# Patient Record
Sex: Female | Born: 1995 | Race: White | Hispanic: No | Marital: Married | Smoking: Current every day smoker
Health system: Southern US, Community
[De-identification: ages and names within clinical notes are randomized; demographics above are authoritative.]

## PROBLEM LIST (undated history)

## (undated) DIAGNOSIS — T148XXA Other injury of unspecified body region, initial encounter: Secondary | ICD-10-CM

## (undated) DIAGNOSIS — J40 Bronchitis, not specified as acute or chronic: Secondary | ICD-10-CM

## (undated) DIAGNOSIS — G43909 Migraine, unspecified, not intractable, without status migrainosus: Secondary | ICD-10-CM

## (undated) DIAGNOSIS — R109 Unspecified abdominal pain: Secondary | ICD-10-CM

## (undated) DIAGNOSIS — H539 Unspecified visual disturbance: Secondary | ICD-10-CM

## (undated) DIAGNOSIS — T7840XA Allergy, unspecified, initial encounter: Secondary | ICD-10-CM

## (undated) DIAGNOSIS — R51 Headache: Secondary | ICD-10-CM

## (undated) HISTORY — DX: Unspecified abdominal pain: R10.9

---
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
## 2014-03-07 ENCOUNTER — Encounter: Payer: Self-pay | Admitting: Obstetrics and Gynecology

## 2014-03-07 ENCOUNTER — Ambulatory Visit (INDEPENDENT_AMBULATORY_CARE_PROVIDER_SITE_OTHER): Payer: Commercial Managed Care - PPO

## 2014-03-07 ENCOUNTER — Other Ambulatory Visit: Payer: Self-pay | Admitting: Obstetrics and Gynecology

## 2014-03-07 ENCOUNTER — Ambulatory Visit (INDEPENDENT_AMBULATORY_CARE_PROVIDER_SITE_OTHER): Payer: Commercial Managed Care - PPO | Admitting: Obstetrics and Gynecology

## 2014-03-07 VITALS — BP 120/84 | Ht 67.0 in | Wt 205.0 lb

## 2014-03-07 DIAGNOSIS — Z30431 Encounter for routine checking of intrauterine contraceptive device: Secondary | ICD-10-CM

## 2014-03-07 DIAGNOSIS — T8339XA Other mechanical complication of intrauterine contraceptive device, initial encounter: Secondary | ICD-10-CM

## 2014-03-07 DIAGNOSIS — N898 Other specified noninflammatory disorders of vagina: Secondary | ICD-10-CM

## 2014-03-07 LAB — POCT WET PREP WITH KOH
Bacteria Wet Prep HPF POC: NEGATIVE
Clue Cells Wet Prep HPF POC: NEGATIVE
KOH Prep POC: NEGATIVE
RBC Wet Prep HPF POC: NEGATIVE
Trichomonas, UA: NEGATIVE
Yeast Wet Prep HPF POC: NEGATIVE

## 2014-03-07 MED ORDER — DOXYCYCLINE HYCLATE 100 MG PO CAPS: 100.0000 mg | ORAL_CAPSULE | Freq: Two times a day (BID) | ORAL | Status: DC

## 2014-03-07 NOTE — Progress Notes (Signed)
This chart was scribed by Jenne Campus, Medical Scribe, for Dr. Mallory Shirk on 03/07/14 at 2:31 PM. This chart was reviewed by Dr. Mallory Shirk and is accurate.   Patient ID: Lisa Bennett, female   DOB: [birth year], 18 y.o.   MRN: [id number] GYNECOLOGY CLINIC PROGRESS NOTE History:  18 y.o. No obstetric history on file. here today for today for IUD string check; Mirena IUD was placed on 06/29/13. No complaints about the Mirena. Having intermittent vaginal discharge that changes between thick and brown to red and slimy. Denies tampon use.  The following portions of the patient's history were reviewed and updated as appropriate: allergies, current medications, past family history, past medical history, past social history, past surgical history and problem list. Last pap smear is not on file.  Review of Systems:  Pertinent items are noted in HPI.  Objective:  Chaperone present for exam which was performed with pt's permission Physical Exam Blood pressure 120/84, height 5\' 7"  (1.702 m), weight 205 lb (92.987 kg). Gen: NAD Abd: Soft, nontender and nondistended Pelvic: Normal appearing external genitalia; normal appearing vaginal mucosa and cervix. Clear discharge present. IUD strings not visualized. Pelvic U/S planned.   Pelvic U/S shows IUD in proper position with surrounding fluid suggestive of infection. Uterus is non-tender  Assessment & Plan:  IUD recheck Doxycycline x10 days for leukorrhea Recheck in 3 weeks Routine preventative health maintenance measures emphasized.

## 2014-03-07 NOTE — Progress Notes (Deleted)
GYNECOLOGY CLINIC PROGRESS NOTE  History:  18 y.o. No obstetric history on file. here today for today for IUD string check; Mirena IUD was placed  ***. No complaints about the Mirena, no concerning side effects.  The following portions of the patient's history were reviewed and updated as appropriate: allergies, current medications, past family history, past medical history, past social history, past surgical history and problem list. Last pap smear on *** was normal, *** HRHPV.  Review of Systems:  Pertinent items are noted in HPI.  Objective:  Physical Exam Blood pressure 120/84, height 5\' 7"  (1.702 m), weight 205 lb (92.987 kg). Gen: NAD Abd: Soft, nontender and nondistended Pelvic: Normal appearing external genitalia; normal appearing vaginal mucosa and cervix.  IUD strings visualized, about *** cm in length outside cervix.   Assessment & Plan:  Normal IUD check. Patient to keep IUD in place for five years; can come in for removal if she desires pregnancy within the next five years. Routine preventative health maintenance measures emphasized.

## 2014-03-28 ENCOUNTER — Ambulatory Visit: Payer: Commercial Managed Care - PPO | Admitting: Obstetrics and Gynecology

## 2014-04-02 ENCOUNTER — Ambulatory Visit: Payer: Commercial Managed Care - PPO | Admitting: Obstetrics and Gynecology

## 2014-07-06 ENCOUNTER — Encounter (HOSPITAL_COMMUNITY): Payer: Self-pay | Admitting: Emergency Medicine

## 2014-07-06 ENCOUNTER — Emergency Department (HOSPITAL_COMMUNITY)
Admission: EM | Admit: 2014-07-06 | Discharge: 2014-07-06 | Disposition: A | Discharge status: Home / Self Care | Payer: Commercial Managed Care - PPO | Attending: Emergency Medicine | Admitting: Emergency Medicine

## 2014-07-06 DIAGNOSIS — F17200 Nicotine dependence, unspecified, uncomplicated: Secondary | ICD-10-CM | POA: Insufficient documentation

## 2014-07-06 DIAGNOSIS — X58XXXA Exposure to other specified factors, initial encounter: Secondary | ICD-10-CM | POA: Insufficient documentation

## 2014-07-06 DIAGNOSIS — S0993XA Unspecified injury of face, initial encounter: Secondary | ICD-10-CM | POA: Diagnosis present

## 2014-07-06 DIAGNOSIS — Y9289 Other specified places as the place of occurrence of the external cause: Secondary | ICD-10-CM | POA: Diagnosis not present

## 2014-07-06 DIAGNOSIS — Z23 Encounter for immunization: Secondary | ICD-10-CM | POA: Diagnosis not present

## 2014-07-06 DIAGNOSIS — Y9389 Activity, other specified: Secondary | ICD-10-CM | POA: Insufficient documentation

## 2014-07-06 DIAGNOSIS — S01311A Laceration without foreign body of right ear, initial encounter: Secondary | ICD-10-CM

## 2014-07-06 DIAGNOSIS — Z8669 Personal history of other diseases of the nervous system and sense organs: Secondary | ICD-10-CM | POA: Insufficient documentation

## 2014-07-06 DIAGNOSIS — S01309A Unspecified open wound of unspecified ear, initial encounter: Secondary | ICD-10-CM | POA: Diagnosis not present

## 2014-07-06 DIAGNOSIS — S199XXA Unspecified injury of neck, initial encounter: Secondary | ICD-10-CM | POA: Diagnosis present

## 2014-07-06 DIAGNOSIS — J45909 Unspecified asthma, uncomplicated: Secondary | ICD-10-CM | POA: Diagnosis not present

## 2014-07-06 DIAGNOSIS — Z880 Allergy status to penicillin: Secondary | ICD-10-CM | POA: Diagnosis not present

## 2014-07-06 MED ORDER — LIDOCAINE HCL (PF) 1 % IJ SOLN
2.0000 mL | Freq: Once | INTRAMUSCULAR | Status: DC
Start: 2014-07-06 — End: 2014-07-06
  Filled 2014-07-06: qty 5

## 2014-07-06 MED ORDER — BACITRACIN ZINC 500 UNIT/GM EX OINT
TOPICAL_OINTMENT | CUTANEOUS | Status: AC
  Filled 2014-07-06: qty 0.9

## 2014-07-06 MED ORDER — OXYCODONE-ACETAMINOPHEN 5-325 MG PO TABS
1.0000 | ORAL_TABLET | Freq: Once | ORAL | Status: AC
  Administered 2014-07-06: 1 via ORAL
  Filled 2014-07-06: qty 1

## 2014-07-06 MED ORDER — CLINDAMYCIN HCL 300 MG PO CAPS: 300.0000 mg | ORAL_CAPSULE | Freq: Four times a day (QID) | ORAL | Status: DC

## 2014-07-06 MED ORDER — TETANUS-DIPHTH-ACELL PERTUSSIS 5-2.5-18.5 LF-MCG/0.5 IM SUSP
0.5000 mL | Freq: Once | INTRAMUSCULAR | Status: AC
  Administered 2014-07-06: 0.5 mL via INTRAMUSCULAR
  Filled 2014-07-06: qty 0.5

## 2014-07-06 NOTE — ED Provider Notes (Signed)
CSN: [id number]     Arrival date & time 07/06/14  0218 History   First MD Initiated Contact with Patient 07/06/14 0245     Chief Complaint  Patient presents with  . Ear Injury      Patient is a 18 y.o. female presenting with skin laceration. The history is provided by the patient.  Laceration Location:  Head/neck Head/neck laceration location:  R ear Pain details:    Quality:  Aching   Severity:  Mild   Progression:  Unchanged Relieved by:  Nothing Worsened by:  Nothing tried Tetanus status:  Out of date pt reports she woke up tonight and had bleeding from right ear.  She noted laceration to outer ear.  She is unsure what caused it.  No trauma reported No hearing loss reported  Past Medical History  Diagnosis Date  . Asthma   . Abdominal pain, recurrent   . Headache(784.0)   . Allergy   . Bronchitis   . Vision abnormalities     wears contacts  . Fracture     shoulder   Past Surgical History  Procedure Laterality Date  . Esophagogastroduodenoscopy  12/25/2011    Procedure: ESOPHAGOGASTRODUODENOSCOPY (EGD);  Surgeon: Oletha Blend, MD;  Location: [place];  Service: Gastroenterology;  Laterality: N/A;   Family History  Problem Relation Age of Onset  . Cancer Mother   . Cholelithiasis Maternal Grandmother   . Cancer Maternal Aunt     ovarian  . Stroke Paternal Grandfather    History  Substance Use Topics  . Smoking status: Current Every Day Smoker -- 0.50 packs/day for 1 years    Types: Cigarettes  . Smokeless tobacco: Never Used  . Alcohol Use: Yes   OB History   Grav Para Term Preterm Abortions TAB SAB Ect Mult Living                 Review of Systems  HENT: Positive for ear pain. Negative for hearing loss.   Skin: Positive for wound.      Allergies  Amoxicillin; Penicillins; and Blackberry flavor  Home Medications   Prior to Admission medications   Medication Sig Start Date End Date Taking? Authorizing Provider  nortriptyline (PAMELOR) 25 MG  capsule Take 25 mg by mouth at bedtime.   Yes Historical Provider, MD  clindamycin (CLEOCIN) 300 MG capsule Take 1 capsule (300 mg total) by mouth 4 (four) times daily. X 7 days 07/06/14   Sharyon Cable, MD   BP 126/90  Pulse 98  Temp(Src) 98.7 F (37.1 C) (Oral)  Resp 18  Ht 5\' 7"  (1.702 m)  Wt 200 lb (90.719 kg)  BMI 31.32 kg/m2  SpO2 98% Physical Exam CONSTITUTIONAL: Well developed/well nourished HEAD: Normocephalic/atraumatic EYES: EOMI/PERRL ENMT: Mucous membranes moist Small laceration to right auricle.  There are small abrasions to right mastoid, no crepitus or significant tenderness.  Ears are symmetric.  No foreign body in ear canal and right TM is intact.  She has piercing "gage" in right ear lobe that is intact NECK: supple no meningeal signs CV: S1/S2 noted, no murmurs/rubs/gallops noted LUNGS: Lungs are clear to auscultation bilaterally, no apparent distress ABDOMEN: soft, nontender, no rebound or guarding NEURO: Pt is awake/alert, moves all extremitiesx4 EXTREMITIES: pulses normal, full ROM SKIN: warm, color normal PSYCH: no abnormalities of mood noted  ED Course  Procedures    LACERATION REPAIR Performed by: Sharyon Cable Consent: Verbal consent obtained. Risks and benefits: risks, benefits and alternatives were discussed Patient  identity confirmed: provided demographic data Time out performed prior to procedure Prepped and Draped in normal sterile fashion Wound explored Laceration Location: right auricle Laceration Length: 0.5cm No Foreign Bodies seen or palpated Anesthesia: local infiltration Local anesthetic: lidocaine 1% without epinephrine Anesthetic total: 2 ml Amount of cleaning: standard Skin closure: simple Number of sutures or staples: 3 vicryl Technique: simple interrupted Patient tolerance: Patient tolerated the procedure well with no immediate complications.   MDM   Final diagnoses:  Laceration of right ear, initial encounter     Nursing notes including past medical history and social history reviewed and considered in documentation  Pt unclear what caused laceration.  Bite wound not excluded.  Antibiotics prescribed for patient     Sharyon Cable, MD 07/06/14 [phone]

## 2014-07-06 NOTE — Discharge Instructions (Signed)
Auricle Injuries °You have an injury to your external ear (auricle). The ear has a layer of skin over cartilage. A cut or bruise to the ear can separate the skin from the cartilage underneath. This can cause problems with healing if blood gathers between the skin and the cartilage. Permanent damage to the ear may result if the excess blood is not drained within 1 to 2 days. °Stitches, tape, or tissue glue may be used to close a cut. A pressure bandage may be used to keep blood from forming under the injured skin. If there is a lot of blood present (hematoma), a needle aspiration may be needed to remove it. You must have the ear checked within 1 to 2 days or as directed if you have had this type of injury. This is see if the blood has accumulated again. Call your caregiver for a follow-up exam as recommended.  °SEEK IMMEDIATE MEDICAL CARE IF: °· You develop severe pain. °· You develop a fever or pus like drainage. °· You have increased hearing loss or other problems. °MAKE SURE YOU:  °· Understand these instructions. °· Will watch your condition. °· Will get help right away if you are not doing well or get worse. °Document Released: 11/02/2005 Document Revised: 01/25/2012 Document Reviewed: 04/21/2007 °ExitCare® Patient Information ©2015 [employer]. This information is not intended to replace advice given to you by your health care provider. Make sure you discuss any questions you have with your health care provider. ° °

## 2014-07-06 NOTE — ED Notes (Signed)
Pt c/o rt outer ear bleeding.

## 2015-05-22 ENCOUNTER — Emergency Department (HOSPITAL_COMMUNITY)
Admission: EM | Admit: 2015-05-22 | Discharge: 2015-05-22 | Disposition: A | Discharge status: Home / Self Care | Payer: Commercial Managed Care - PPO | Attending: Emergency Medicine | Admitting: Emergency Medicine

## 2015-05-22 ENCOUNTER — Encounter (HOSPITAL_COMMUNITY): Payer: Self-pay | Admitting: Emergency Medicine

## 2015-05-22 DIAGNOSIS — Z720 Tobacco use: Secondary | ICD-10-CM | POA: Insufficient documentation

## 2015-05-22 DIAGNOSIS — R519 Headache, unspecified: Secondary | ICD-10-CM

## 2015-05-22 DIAGNOSIS — Z8709 Personal history of other diseases of the respiratory system: Secondary | ICD-10-CM | POA: Insufficient documentation

## 2015-05-22 DIAGNOSIS — Z8781 Personal history of (healed) traumatic fracture: Secondary | ICD-10-CM | POA: Insufficient documentation

## 2015-05-22 DIAGNOSIS — G43909 Migraine, unspecified, not intractable, without status migrainosus: Secondary | ICD-10-CM | POA: Insufficient documentation

## 2015-05-22 DIAGNOSIS — R51 Headache: Secondary | ICD-10-CM

## 2015-05-22 DIAGNOSIS — J45909 Unspecified asthma, uncomplicated: Secondary | ICD-10-CM | POA: Diagnosis not present

## 2015-05-22 DIAGNOSIS — Z880 Allergy status to penicillin: Secondary | ICD-10-CM | POA: Insufficient documentation

## 2015-05-22 HISTORY — DX: Migraine, unspecified, not intractable, without status migrainosus: G43.909

## 2015-05-22 MED ORDER — ONDANSETRON 8 MG PO TBDP
8.0000 mg | ORAL_TABLET | Freq: Once | ORAL | Status: AC
  Administered 2015-05-22: 8 mg via ORAL
  Filled 2015-05-22: qty 1

## 2015-05-22 MED ORDER — BUTALBITAL-APAP-CAFFEINE 50-325-40 MG PO TABS: 1.0000 | ORAL_TABLET | Freq: Four times a day (QID) | ORAL | Status: AC | PRN

## 2015-05-22 MED ORDER — HYDROMORPHONE HCL 1 MG/ML IJ SOLN
1.0000 mg | Freq: Once | INTRAMUSCULAR | Status: AC
Start: 2015-05-22 — End: 2015-05-22
  Administered 2015-05-22: 1 mg via INTRAMUSCULAR
  Filled 2015-05-22: qty 1

## 2015-05-22 MED ORDER — PROMETHAZINE HCL 25 MG PO TABS: 25.0000 mg | ORAL_TABLET | Freq: Four times a day (QID) | ORAL | Status: DC | PRN

## 2015-05-22 NOTE — Discharge Instructions (Signed)
Medication for headache and nausea. Increase fluids. Follow-up your doctor.

## 2015-05-22 NOTE — ED Notes (Signed)
Pt made aware to return if symptoms worsen or if any life threatening symptoms occur.   

## 2015-05-22 NOTE — ED Notes (Addendum)
Pt reports headache since yesterday. Pt reports light and sound sensitivity. nad noted. Pt reports history of migraines and reports takes daily migraine meds but is out of px.

## 2015-05-22 NOTE — ED Provider Notes (Signed)
CSN: [id number]     Arrival date & time 05/22/15  1345 History   First MD Initiated Contact with Patient 05/22/15 1608     Chief Complaint  Patient presents with  . Headache     (Consider location/radiation/quality/duration/timing/severity/associated sxs/prior Treatment) HPI..... Occipital headache since yesterday. Patient took 2 ibuprofen tablets which helped a small amount. She had been on prophylactic nortriptyline in the past, but this has been discontinued secondary to no primary care relationship. Slight photophobia. No stiff neck, neurological deficits, fever.  Past Medical History  Diagnosis Date  . Asthma   . Abdominal pain, recurrent   . Headache(784.0)   . Allergy   . Bronchitis   . Vision abnormalities     wears contacts  . Fracture     shoulder  . Migraines    Past Surgical History  Procedure Laterality Date  . Esophagogastroduodenoscopy  12/25/2011    Procedure: ESOPHAGOGASTRODUODENOSCOPY (EGD);  Surgeon: Oletha Blend, MD;  Location: [facility];  Service: Gastroenterology;  Laterality: N/A;   Family History  Problem Relation Age of Onset  . Cancer Mother   . Cholelithiasis Maternal Grandmother   . Cancer Maternal Aunt     ovarian  . Stroke Paternal Grandfather    History  Substance Use Topics  . Smoking status: Current Every Day Smoker -- 0.50 packs/day for 1 years    Types: Cigarettes  . Smokeless tobacco: Never Used  . Alcohol Use: Yes   OB History    No data available     Review of Systems  All other systems reviewed and are negative.     Allergies  Amoxicillin; Penicillins; and Blackberry flavor  Home Medications   Prior to Admission medications   Medication Sig Start Date End Date Taking? Authorizing Provider  ibuprofen (ADVIL,MOTRIN) 200 MG tablet Take 400 mg by mouth every 6 (six) hours as needed.   Yes Historical Provider, MD  butalbital-acetaminophen-caffeine (FIORICET) 50-325-40 MG per tablet Take 1-2 tablets by mouth every 6 (six)  hours as needed for headache. 05/22/15 05/21/16  Nat Christen, MD  promethazine (PHENERGAN) 25 MG tablet Take 1 tablet (25 mg total) by mouth every 6 (six) hours as needed. 05/22/15   Nat Christen, MD   BP 124/61 mmHg  Pulse 80  Temp(Src) 98.4 F (36.9 C) (Oral)  Resp 18  Ht 5' 7.5" (1.715 m)  Wt 179 lb (81.194 kg)  BMI 27.61 kg/m2  SpO2 100%  LMP 05/22/2015 Physical Exam  Constitutional: She is oriented to person, place, and time.  Slight photophobia, no acute distress  HENT:  Head: Normocephalic and atraumatic.  Eyes: Conjunctivae and EOM are normal. Pupils are equal, round, and reactive to light.  Neck: Normal range of motion. Neck supple.  Cardiovascular: Normal rate and regular rhythm.   Pulmonary/Chest: Effort normal and breath sounds normal.  Abdominal: Soft. Bowel sounds are normal.  Musculoskeletal: Normal range of motion.  Neurological: She is alert and oriented to person, place, and time.  Skin: Skin is warm and dry.  Psychiatric: She has a normal mood and affect. Her behavior is normal.  Nursing note and vitals reviewed.   ED Course  Procedures (including critical care time) Labs Review Labs Reviewed - No data to display  Imaging Review No results found.   EKG Interpretation None      MDM   Final diagnoses:  Occipital headache    Patient feels better after pain management. No evidence of meningitis. Discharge medications Fioricet and Phenergan 25 mg  Nat Christen, MD 05/22/15 [phone]

## 2015-06-20 IMAGING — US US ABDOMEN COMPLETE
1 series · 14 of 25 positions shown · non-contrast
Comparison: Abdominal ultrasound 11/24/2011

CLINICAL DATA: Epigastric pain.

EXAM:
ULTRASOUND ABDOMEN COMPLETE

[Series 1: us abdomen complete · 0.22mm/px · 14 of 86 slices shown]
[im 1/86]
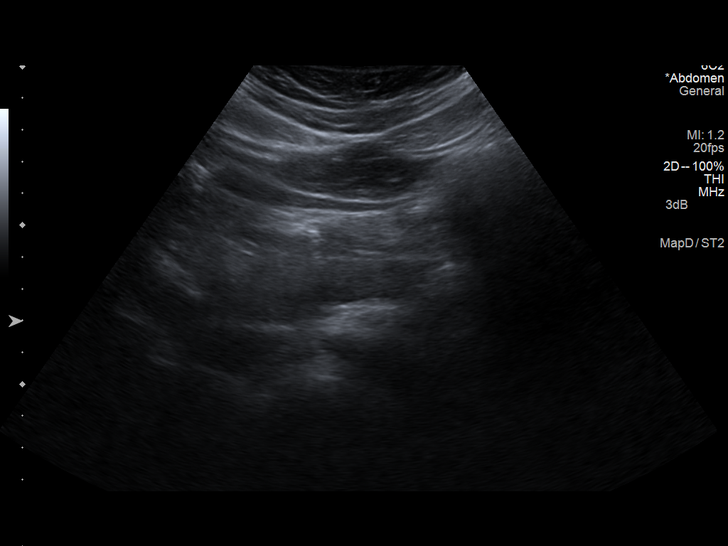
[im 8/86]
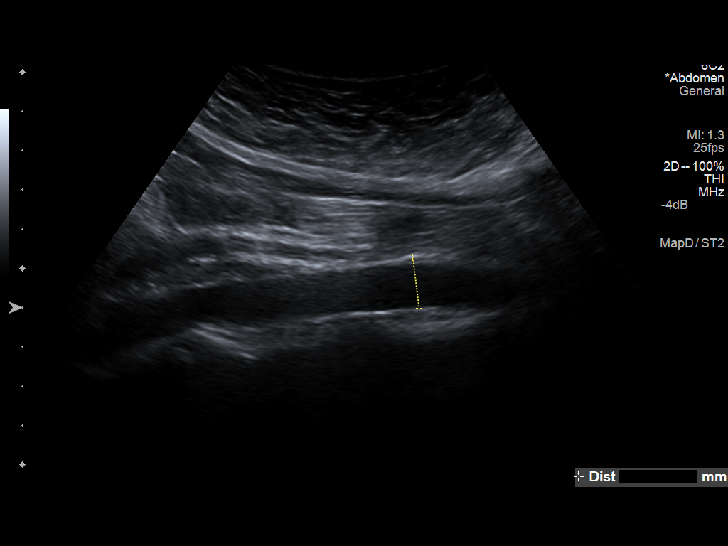
[im 15/86]
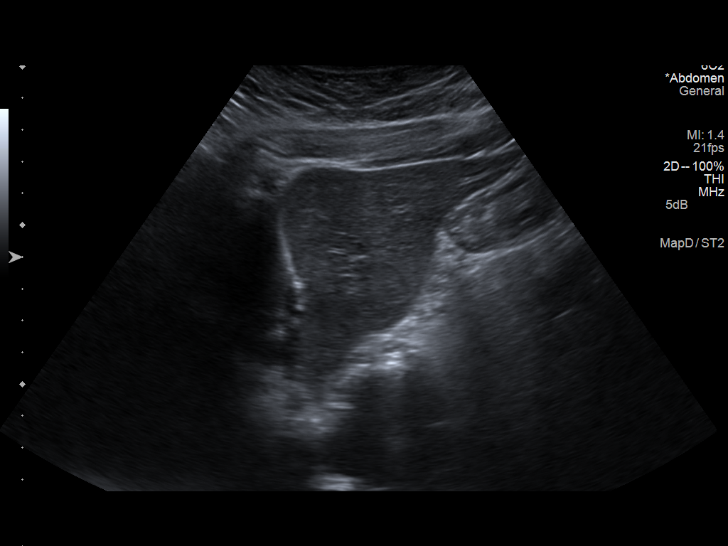
[im 22/86]
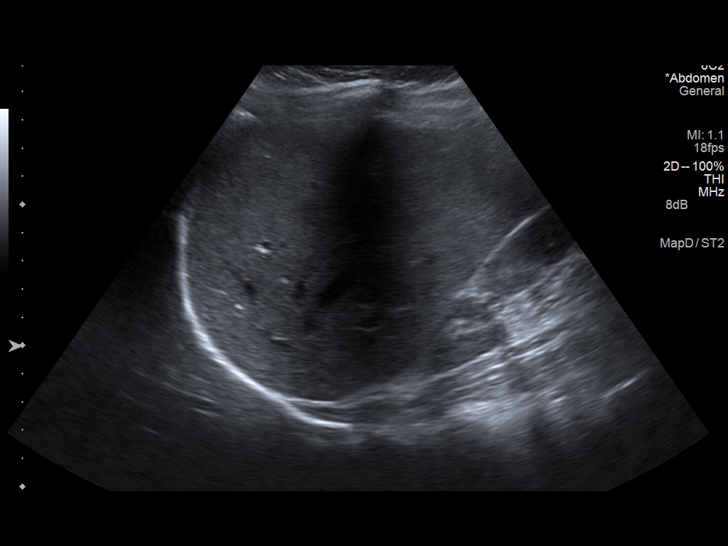
[im 29/86]
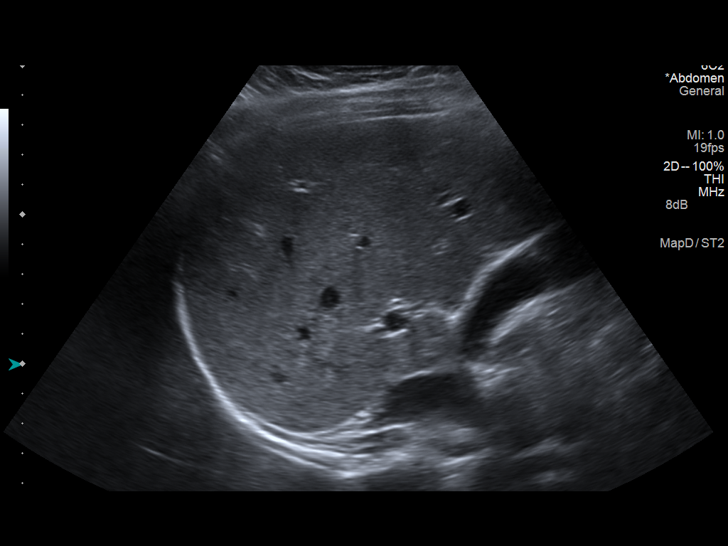
[im 32/86]
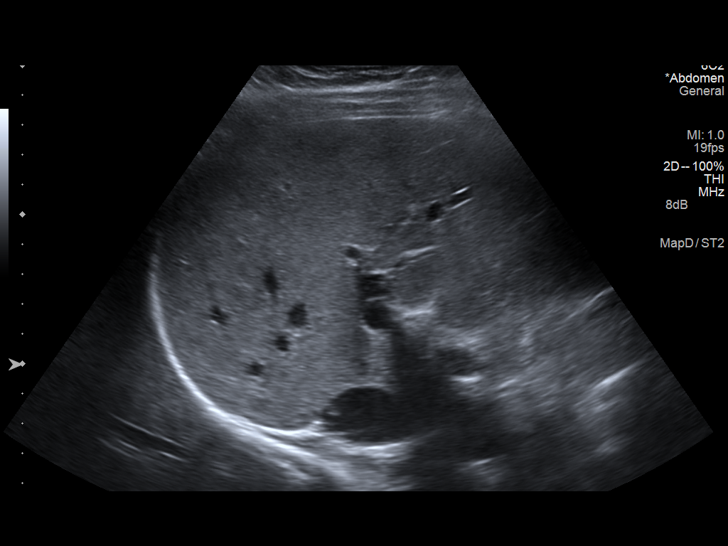
[im 39/86]
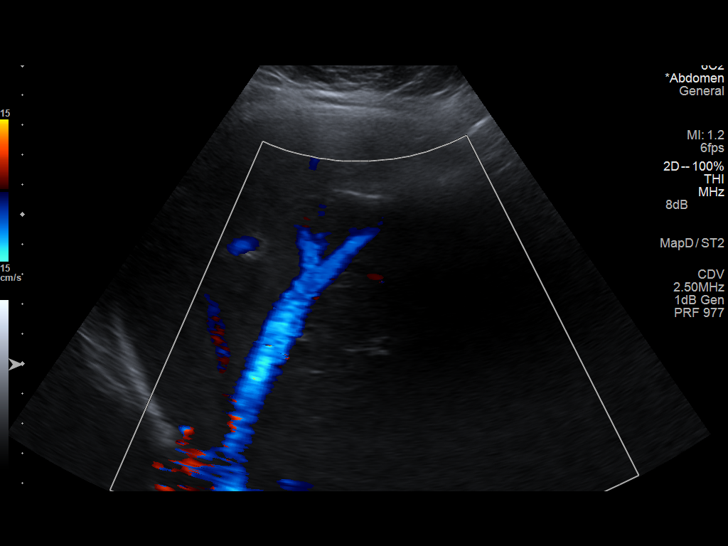
[im 47/86]
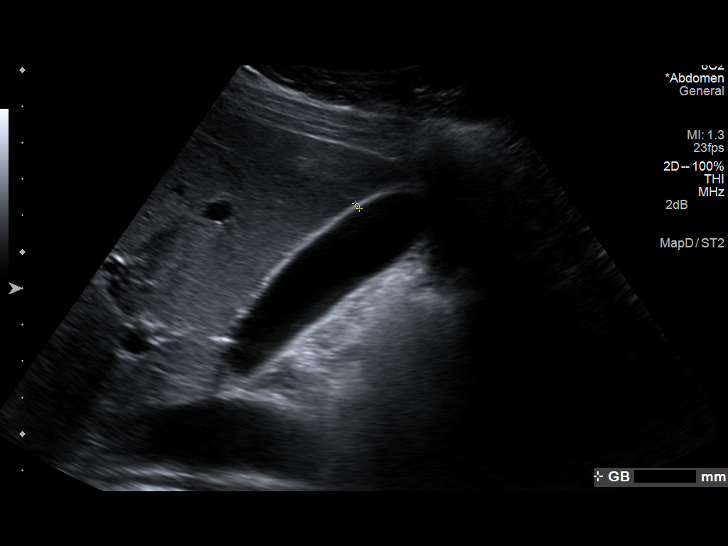
[im 54/86]
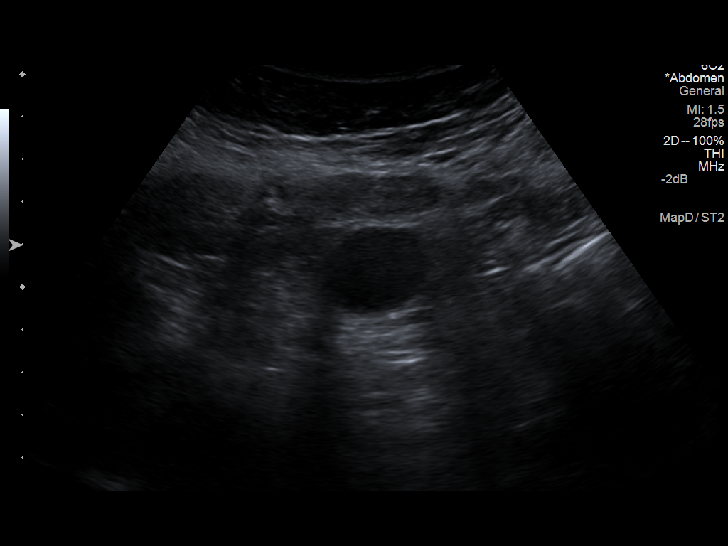
[im 57/86]
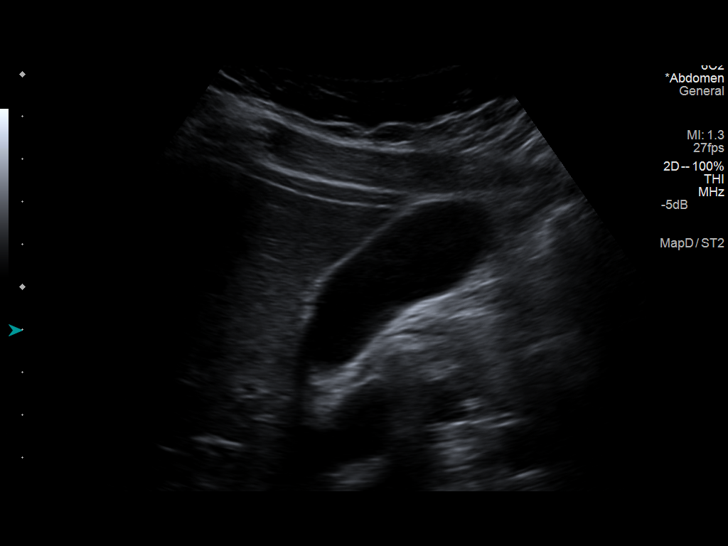
[im 64/86]
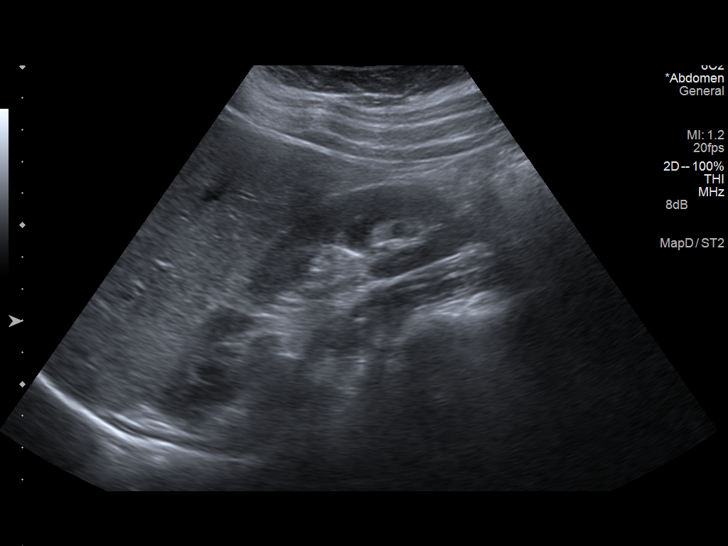
[im 71/86]
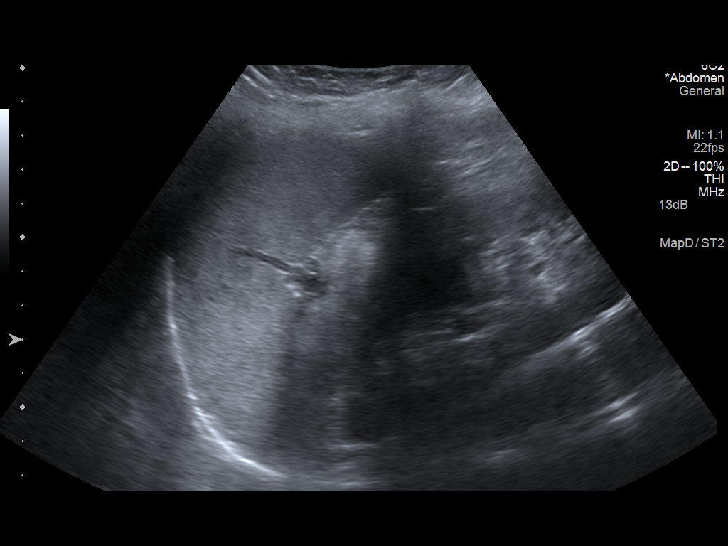
[im 78/86]
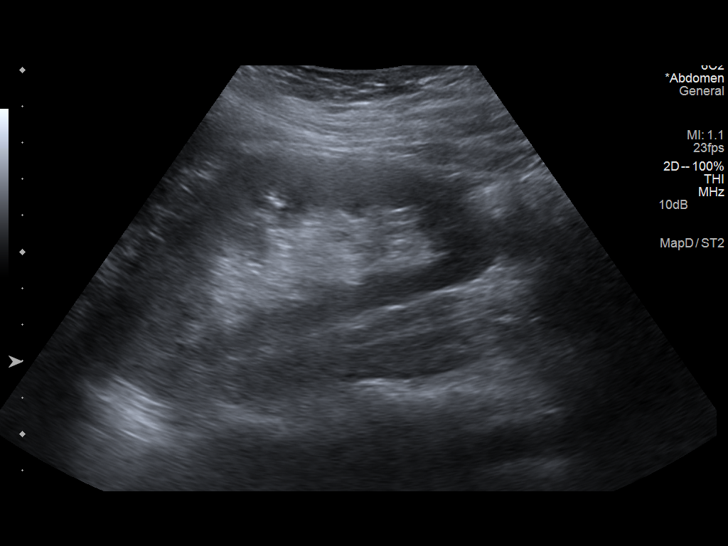
[im 86/86]
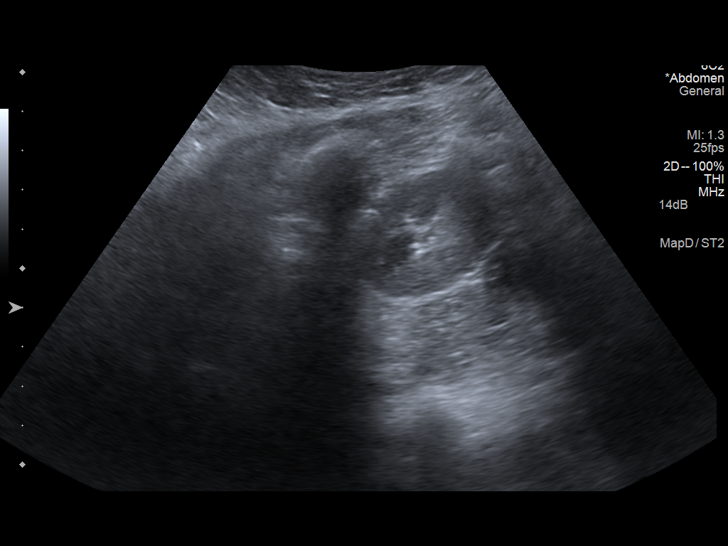

[14 of 25 positions shown; findings below may reference images not displayed]

FINDINGS: Gallbladder:

No gallstones or wall thickening visualized. No sonographic Murphy
sign noted.

Common bile duct:

Diameter:

Liver:

No focal lesion identified. Within normal limits in parenchymal
echogenicity.

IVC:

No abnormality visualized.

Pancreas:

Limited evaluation due to overlying bowel gas. Visualized portion
unremarkable.

Spleen:

Size and appearance within normal limits.

Right Kidney:

Length: 11.5. Echogenicity within normal limits. No mass or
hydronephrosis visualized.

Left Kidney:

Length: 11.3. Echogenicity within normal limits. No mass or
hydronephrosis visualized.

Abdominal aorta:

No aneurysm visualized.

Other findings:

None.
IMPRESSION: No etiology of abdominal pain identified. Limited visualization of
the pancreas.

## 2015-12-31 DIAGNOSIS — D259 Leiomyoma of uterus, unspecified: Secondary | ICD-10-CM

## 2015-12-31 DIAGNOSIS — N938 Other specified abnormal uterine and vaginal bleeding: Secondary | ICD-10-CM

## 2015-12-31 DIAGNOSIS — R1909 Other intra-abdominal and pelvic swelling, mass and lump: Secondary | ICD-10-CM

## 2020-03-29 ENCOUNTER — Telehealth: Payer: Self-pay | Admitting: Adult Health

## 2020-03-29 NOTE — Telephone Encounter (Signed)

## 2020-04-01 ENCOUNTER — Encounter: Payer: Self-pay | Admitting: Adult Health

## 2020-04-01 ENCOUNTER — Ambulatory Visit: Payer: Self-pay | Admitting: Adult Health

## 2020-04-01 VITALS — BP 115/79 | HR 67 | Ht 67.5 in | Wt 233.6 lb

## 2020-04-01 DIAGNOSIS — N926 Irregular menstruation, unspecified: Secondary | ICD-10-CM

## 2020-04-01 DIAGNOSIS — Z3202 Encounter for pregnancy test, result negative: Secondary | ICD-10-CM

## 2020-04-01 LAB — POCT URINE PREGNANCY: Preg Test, Ur: NEGATIVE

## 2020-04-01 NOTE — Progress Notes (Signed)
  Subjective:     Patient ID: Lisa Bennett, female   DOB: 01-17-1996, 24 y.o.   MRN: [id number]  HPI Lisa Bennett is a 24 year old white female, married, G1P1 in for UPT, has missed a period and had faint+HPT, last week.  PCP is Lisa Matter PA   Review of Systems Has missed a period and had faint +HPT, last week Reviewed past medical,surgical, social and family history. Reviewed medications and allergies.     Objective:   Physical Exam BP 115/79 (BP Location: Right Arm, Patient Position: Sitting, Cuff Size: Normal)   Pulse 67   Ht 5' 7.5" (1.715 m)   Wt 233 lb 9.6 oz (106 kg)   LMP 02/19/2020 (Within Days)   BMI 36.05 kg/m UPT is negative Skin warm and dry. Lungs: clear to ausculation bilaterally. Cardiovascular: regular rate and rhythm AA 4 Fall risk is low PHQ 9 score is 0    Assessment:     1. Missed period Will check [facility] today and talk tomorrow     Plan:    No alcohol for now Follow up prn

## 2021-06-18 DIAGNOSIS — Z114 Encounter for screening for human immunodeficiency virus [HIV]: Secondary | ICD-10-CM | POA: Diagnosis not present

## 2021-06-18 DIAGNOSIS — Z01419 Encounter for gynecological examination (general) (routine) without abnormal findings: Secondary | ICD-10-CM | POA: Diagnosis not present

## 2021-06-18 DIAGNOSIS — Z30015 Encounter for initial prescription of vaginal ring hormonal contraceptive: Secondary | ICD-10-CM | POA: Diagnosis not present

## 2021-06-18 DIAGNOSIS — Z113 Encounter for screening for infections with a predominantly sexual mode of transmission: Secondary | ICD-10-CM | POA: Diagnosis not present

## 2021-06-27 DIAGNOSIS — A549 Gonococcal infection, unspecified: Secondary | ICD-10-CM | POA: Diagnosis not present

## 2021-06-27 DIAGNOSIS — A749 Chlamydial infection, unspecified: Secondary | ICD-10-CM | POA: Diagnosis not present

## 2021-07-03 DIAGNOSIS — Z3049 Encounter for surveillance of other contraceptives: Secondary | ICD-10-CM | POA: Diagnosis not present

## 2021-07-03 DIAGNOSIS — B373 Candidiasis of vulva and vagina: Secondary | ICD-10-CM | POA: Diagnosis not present

## 2021-07-29 ENCOUNTER — Other Ambulatory Visit (HOSPITAL_COMMUNITY)
Admission: RE | Admit: 2021-07-29 | Discharge: 2021-07-29 | Disposition: A | Discharge status: Home / Self Care | Payer: Medicaid Other | Source: Ambulatory Visit | Attending: Nurse Practitioner | Admitting: Nurse Practitioner

## 2021-07-29 DIAGNOSIS — N870 Mild cervical dysplasia: Secondary | ICD-10-CM | POA: Diagnosis not present

## 2021-07-29 DIAGNOSIS — Z3049 Encounter for surveillance of other contraceptives: Secondary | ICD-10-CM | POA: Diagnosis not present

## 2021-07-29 DIAGNOSIS — R87610 Atypical squamous cells of undetermined significance on cytologic smear of cervix (ASC-US): Secondary | ICD-10-CM | POA: Diagnosis not present

## 2021-08-01 LAB — SURGICAL PATHOLOGY

## 2022-03-02 DIAGNOSIS — N760 Acute vaginitis: Secondary | ICD-10-CM | POA: Diagnosis not present

## 2022-03-02 DIAGNOSIS — Z114 Encounter for screening for human immunodeficiency virus [HIV]: Secondary | ICD-10-CM | POA: Diagnosis not present

## 2022-03-02 DIAGNOSIS — Z113 Encounter for screening for infections with a predominantly sexual mode of transmission: Secondary | ICD-10-CM | POA: Diagnosis not present

## 2022-03-02 DIAGNOSIS — Z202 Contact with and (suspected) exposure to infections with a predominantly sexual mode of transmission: Secondary | ICD-10-CM | POA: Diagnosis not present

## 2022-03-30 DIAGNOSIS — Z114 Encounter for screening for human immunodeficiency virus [HIV]: Secondary | ICD-10-CM | POA: Diagnosis not present

## 2022-03-30 DIAGNOSIS — Z113 Encounter for screening for infections with a predominantly sexual mode of transmission: Secondary | ICD-10-CM | POA: Diagnosis not present

## 2022-03-30 DIAGNOSIS — Z1151 Encounter for screening for human papillomavirus (HPV): Secondary | ICD-10-CM | POA: Diagnosis not present

## 2022-03-30 DIAGNOSIS — Z01419 Encounter for gynecological examination (general) (routine) without abnormal findings: Secondary | ICD-10-CM | POA: Diagnosis not present

## 2022-03-30 DIAGNOSIS — R87612 Low grade squamous intraepithelial lesion on cytologic smear of cervix (LGSIL): Secondary | ICD-10-CM | POA: Diagnosis not present

## 2022-07-27 DIAGNOSIS — O468X1 Other antepartum hemorrhage, first trimester: Secondary | ICD-10-CM | POA: Diagnosis not present

## 2022-07-27 DIAGNOSIS — O418X10 Other specified disorders of amniotic fluid and membranes, first trimester, not applicable or unspecified: Secondary | ICD-10-CM | POA: Diagnosis not present

## 2022-07-27 DIAGNOSIS — Z3689 Encounter for other specified antenatal screening: Secondary | ICD-10-CM | POA: Diagnosis not present

## 2022-07-27 DIAGNOSIS — N912 Amenorrhea, unspecified: Secondary | ICD-10-CM | POA: Diagnosis not present

## 2022-07-27 DIAGNOSIS — Z3201 Encounter for pregnancy test, result positive: Secondary | ICD-10-CM | POA: Diagnosis not present

## 2022-08-10 DIAGNOSIS — O418X10 Other specified disorders of amniotic fluid and membranes, first trimester, not applicable or unspecified: Secondary | ICD-10-CM | POA: Diagnosis not present

## 2022-08-10 DIAGNOSIS — O468X1 Other antepartum hemorrhage, first trimester: Secondary | ICD-10-CM | POA: Diagnosis not present

## 2022-08-31 DIAGNOSIS — O468X1 Other antepartum hemorrhage, first trimester: Secondary | ICD-10-CM | POA: Diagnosis not present

## 2022-08-31 DIAGNOSIS — O418X10 Other specified disorders of amniotic fluid and membranes, first trimester, not applicable or unspecified: Secondary | ICD-10-CM | POA: Diagnosis not present

## 2022-08-31 DIAGNOSIS — Z36 Encounter for antenatal screening for chromosomal anomalies: Secondary | ICD-10-CM | POA: Diagnosis not present

## 2022-08-31 DIAGNOSIS — Z3689 Encounter for other specified antenatal screening: Secondary | ICD-10-CM | POA: Diagnosis not present

## 2022-11-17 DIAGNOSIS — Z1389 Encounter for screening for other disorder: Secondary | ICD-10-CM | POA: Diagnosis not present

## 2022-12-15 DIAGNOSIS — Z23 Encounter for immunization: Secondary | ICD-10-CM | POA: Diagnosis not present

## 2022-12-15 DIAGNOSIS — O26892 Other specified pregnancy related conditions, second trimester: Secondary | ICD-10-CM | POA: Diagnosis not present

## 2022-12-15 DIAGNOSIS — Z6791 Unspecified blood type, Rh negative: Secondary | ICD-10-CM | POA: Diagnosis not present

## 2023-01-27 DIAGNOSIS — Z362 Encounter for other antenatal screening follow-up: Secondary | ICD-10-CM | POA: Diagnosis not present

## 2023-01-27 DIAGNOSIS — Z3A33 33 weeks gestation of pregnancy: Secondary | ICD-10-CM | POA: Diagnosis not present

## 2023-01-27 DIAGNOSIS — O321XX0 Maternal care for breech presentation, not applicable or unspecified: Secondary | ICD-10-CM | POA: Diagnosis not present

## 2023-01-27 DIAGNOSIS — O99210 Obesity complicating pregnancy, unspecified trimester: Secondary | ICD-10-CM | POA: Diagnosis not present

## 2023-02-10 DIAGNOSIS — O99210 Obesity complicating pregnancy, unspecified trimester: Secondary | ICD-10-CM | POA: Diagnosis not present

## 2023-03-04 DIAGNOSIS — O99210 Obesity complicating pregnancy, unspecified trimester: Secondary | ICD-10-CM | POA: Diagnosis not present

## 2023-03-06 DIAGNOSIS — O99824 Streptococcus B carrier state complicating childbirth: Secondary | ICD-10-CM | POA: Diagnosis not present

## 2023-03-06 DIAGNOSIS — Z3A39 39 weeks gestation of pregnancy: Secondary | ICD-10-CM | POA: Diagnosis not present

## 2023-03-06 DIAGNOSIS — D62 Acute posthemorrhagic anemia: Secondary | ICD-10-CM | POA: Diagnosis not present

## 2023-03-06 DIAGNOSIS — O9081 Anemia of the puerperium: Secondary | ICD-10-CM | POA: Diagnosis not present

## 2023-03-07 DIAGNOSIS — O99214 Obesity complicating childbirth: Secondary | ICD-10-CM | POA: Diagnosis not present

## 2023-03-07 DIAGNOSIS — O99824 Streptococcus B carrier state complicating childbirth: Secondary | ICD-10-CM | POA: Diagnosis not present

## 2023-03-07 DIAGNOSIS — E669 Obesity, unspecified: Secondary | ICD-10-CM | POA: Diagnosis not present

## 2023-03-07 DIAGNOSIS — Z3A39 39 weeks gestation of pregnancy: Secondary | ICD-10-CM | POA: Diagnosis not present

## 2023-03-07 DIAGNOSIS — Z3A00 Weeks of gestation of pregnancy not specified: Secondary | ICD-10-CM | POA: Diagnosis not present

## 2023-03-07 DIAGNOSIS — O99333 Smoking (tobacco) complicating pregnancy, third trimester: Secondary | ICD-10-CM | POA: Diagnosis not present

## 2023-04-21 DIAGNOSIS — Z30430 Encounter for insertion of intrauterine contraceptive device: Secondary | ICD-10-CM | POA: Diagnosis not present

## 2023-04-21 DIAGNOSIS — Z3202 Encounter for pregnancy test, result negative: Secondary | ICD-10-CM | POA: Diagnosis not present

## 2023-05-13 DIAGNOSIS — R87615 Unsatisfactory cytologic smear of cervix: Secondary | ICD-10-CM | POA: Diagnosis not present

## 2023-05-13 DIAGNOSIS — Z30431 Encounter for routine checking of intrauterine contraceptive device: Secondary | ICD-10-CM | POA: Diagnosis not present

## 2023-06-18 DIAGNOSIS — H5213 Myopia, bilateral: Secondary | ICD-10-CM | POA: Diagnosis not present

## 2023-07-22 DIAGNOSIS — Z113 Encounter for screening for infections with a predominantly sexual mode of transmission: Secondary | ICD-10-CM | POA: Diagnosis not present

## 2023-07-22 DIAGNOSIS — N760 Acute vaginitis: Secondary | ICD-10-CM | POA: Diagnosis not present

## 2023-07-22 DIAGNOSIS — Z114 Encounter for screening for human immunodeficiency virus [HIV]: Secondary | ICD-10-CM | POA: Diagnosis not present

## 2023-08-05 DIAGNOSIS — A549 Gonococcal infection, unspecified: Secondary | ICD-10-CM | POA: Diagnosis not present

## 2023-11-04 DIAGNOSIS — Z114 Encounter for screening for human immunodeficiency virus [HIV]: Secondary | ICD-10-CM | POA: Diagnosis not present

## 2023-11-04 DIAGNOSIS — Z113 Encounter for screening for infections with a predominantly sexual mode of transmission: Secondary | ICD-10-CM | POA: Diagnosis not present

## 2024-06-08 DIAGNOSIS — R87612 Low grade squamous intraepithelial lesion on cytologic smear of cervix (LGSIL): Secondary | ICD-10-CM | POA: Diagnosis not present

## 2024-06-08 DIAGNOSIS — Z30431 Encounter for routine checking of intrauterine contraceptive device: Secondary | ICD-10-CM | POA: Diagnosis not present

## 2024-06-08 DIAGNOSIS — Z113 Encounter for screening for infections with a predominantly sexual mode of transmission: Secondary | ICD-10-CM | POA: Diagnosis not present

## 2024-06-08 DIAGNOSIS — A630 Anogenital (venereal) warts: Secondary | ICD-10-CM | POA: Diagnosis not present

## 2024-06-08 DIAGNOSIS — Z01419 Encounter for gynecological examination (general) (routine) without abnormal findings: Secondary | ICD-10-CM | POA: Diagnosis not present

## 2024-06-08 DIAGNOSIS — L680 Hirsutism: Secondary | ICD-10-CM | POA: Diagnosis not present

## 2024-06-08 DIAGNOSIS — Z13228 Encounter for screening for other metabolic disorders: Secondary | ICD-10-CM | POA: Diagnosis not present

## 2024-07-25 ENCOUNTER — Other Ambulatory Visit (HOSPITAL_COMMUNITY)
Admission: RE | Admit: 2024-07-25 | Discharge: 2024-07-25 | Disposition: A | Discharge status: Home / Self Care | Source: Other Acute Inpatient Hospital | Attending: Nurse Practitioner | Admitting: Nurse Practitioner

## 2024-07-25 DIAGNOSIS — R87810 Cervical high risk human papillomavirus (HPV) DNA test positive: Secondary | ICD-10-CM | POA: Insufficient documentation

## 2024-07-25 DIAGNOSIS — D069 Carcinoma in situ of cervix, unspecified: Secondary | ICD-10-CM | POA: Diagnosis not present

## 2024-07-25 DIAGNOSIS — Z3202 Encounter for pregnancy test, result negative: Secondary | ICD-10-CM | POA: Diagnosis not present

## 2024-07-25 DIAGNOSIS — R87612 Low grade squamous intraepithelial lesion on cytologic smear of cervix (LGSIL): Secondary | ICD-10-CM | POA: Diagnosis not present

## 2024-07-31 LAB — SURGICAL PATHOLOGY

## 2024-08-10 DIAGNOSIS — N871 Moderate cervical dysplasia: Secondary | ICD-10-CM | POA: Diagnosis not present

## 2024-09-01 DIAGNOSIS — N871 Moderate cervical dysplasia: Secondary | ICD-10-CM | POA: Diagnosis not present

## 2024-09-01 DIAGNOSIS — N72 Inflammatory disease of cervix uteri: Secondary | ICD-10-CM | POA: Diagnosis not present

## 2024-09-11 DIAGNOSIS — L680 Hirsutism: Secondary | ICD-10-CM | POA: Diagnosis not present

## 2024-09-11 DIAGNOSIS — Z30431 Encounter for routine checking of intrauterine contraceptive device: Secondary | ICD-10-CM | POA: Diagnosis not present

## 2024-09-11 DIAGNOSIS — R7989 Other specified abnormal findings of blood chemistry: Secondary | ICD-10-CM | POA: Diagnosis not present

## 2024-09-11 DIAGNOSIS — A630 Anogenital (venereal) warts: Secondary | ICD-10-CM | POA: Diagnosis not present

## 2024-09-11 DIAGNOSIS — B3731 Acute candidiasis of vulva and vagina: Secondary | ICD-10-CM | POA: Diagnosis not present

## 2024-09-11 DIAGNOSIS — L292 Pruritus vulvae: Secondary | ICD-10-CM | POA: Diagnosis not present

## 2024-11-02 DIAGNOSIS — Z1329 Encounter for screening for other suspected endocrine disorder: Secondary | ICD-10-CM | POA: Diagnosis not present

## 2024-11-02 DIAGNOSIS — E669 Obesity, unspecified: Secondary | ICD-10-CM | POA: Diagnosis not present

## 2024-11-02 DIAGNOSIS — Z720 Tobacco use: Secondary | ICD-10-CM | POA: Diagnosis not present

## 2024-11-02 DIAGNOSIS — Z13220 Encounter for screening for lipoid disorders: Secondary | ICD-10-CM | POA: Diagnosis not present
